# Patient Record
Sex: Male | Born: 1955 | Race: White | Hispanic: No | Marital: Married | Smoking: Former smoker
Health system: Southern US, Community
[De-identification: ages and names within clinical notes are randomized; demographics above are authoritative.]

## PROBLEM LIST (undated history)

## (undated) DIAGNOSIS — E785 Hyperlipidemia, unspecified: Secondary | ICD-10-CM

## (undated) DIAGNOSIS — G4730 Sleep apnea, unspecified: Secondary | ICD-10-CM

## (undated) HISTORY — DX: Hyperlipidemia, unspecified: E78.5

## (undated) HISTORY — DX: Sleep apnea, unspecified: G47.30

## (undated) HISTORY — PX: INGUINAL HERNIA REPAIR: SUR1180

---

## 1998-10-20 ENCOUNTER — Emergency Department (HOSPITAL_COMMUNITY): Admission: EM | Admit: 1998-10-20 | Discharge: 1998-10-20 | Payer: Self-pay

## 2000-08-19 ENCOUNTER — Ambulatory Visit (HOSPITAL_BASED_OUTPATIENT_CLINIC_OR_DEPARTMENT_OTHER): Admission: RE | Admit: 2000-08-19 | Discharge: 2000-08-19 | Payer: Self-pay | Admitting: Internal Medicine

## 2009-04-23 ENCOUNTER — Ambulatory Visit: Payer: Self-pay | Admitting: Sports Medicine

## 2009-04-23 ENCOUNTER — Encounter: Admission: RE | Admit: 2009-04-23 | Discharge: 2009-04-23 | Payer: Self-pay | Admitting: *Deleted

## 2009-04-23 DIAGNOSIS — M542 Cervicalgia: Secondary | ICD-10-CM | POA: Insufficient documentation

## 2009-04-23 LAB — CONVERTED CEMR LAB
HDL goal, serum: 40 mg/dL
LDL Goal: 160 mg/dL

## 2009-04-24 DIAGNOSIS — E785 Hyperlipidemia, unspecified: Secondary | ICD-10-CM | POA: Insufficient documentation

## 2009-05-02 ENCOUNTER — Ambulatory Visit: Payer: Self-pay | Admitting: Sports Medicine

## 2009-05-07 ENCOUNTER — Ambulatory Visit: Payer: Self-pay | Admitting: Sports Medicine

## 2009-05-07 DIAGNOSIS — M533 Sacrococcygeal disorders, not elsewhere classified: Secondary | ICD-10-CM | POA: Insufficient documentation

## 2009-05-07 DIAGNOSIS — M479 Spondylosis, unspecified: Secondary | ICD-10-CM | POA: Insufficient documentation

## 2009-11-27 ENCOUNTER — Ambulatory Visit: Payer: Self-pay | Admitting: Pulmonary Disease

## 2009-11-27 DIAGNOSIS — G2581 Restless legs syndrome: Secondary | ICD-10-CM | POA: Insufficient documentation

## 2009-11-27 DIAGNOSIS — G4710 Hypersomnia, unspecified: Secondary | ICD-10-CM | POA: Insufficient documentation

## 2009-11-27 DIAGNOSIS — J309 Allergic rhinitis, unspecified: Secondary | ICD-10-CM | POA: Insufficient documentation

## 2009-12-14 ENCOUNTER — Encounter: Payer: Self-pay | Admitting: Pulmonary Disease

## 2009-12-14 ENCOUNTER — Ambulatory Visit (HOSPITAL_BASED_OUTPATIENT_CLINIC_OR_DEPARTMENT_OTHER): Admission: RE | Admit: 2009-12-14 | Discharge: 2009-12-14 | Payer: Self-pay | Admitting: Pulmonary Disease

## 2009-12-19 ENCOUNTER — Ambulatory Visit: Payer: Self-pay | Admitting: Pulmonary Disease

## 2009-12-31 ENCOUNTER — Telehealth (INDEPENDENT_AMBULATORY_CARE_PROVIDER_SITE_OTHER): Payer: Self-pay | Admitting: *Deleted

## 2010-01-20 ENCOUNTER — Telehealth (INDEPENDENT_AMBULATORY_CARE_PROVIDER_SITE_OTHER): Payer: Self-pay | Admitting: *Deleted

## 2010-02-13 ENCOUNTER — Ambulatory Visit (HOSPITAL_BASED_OUTPATIENT_CLINIC_OR_DEPARTMENT_OTHER): Admission: RE | Admit: 2010-02-13 | Discharge: 2010-02-13 | Payer: Self-pay | Admitting: Orthopedic Surgery

## 2010-12-02 NOTE — Progress Notes (Signed)
Summary: cpap/ speak to pcc  Phone Note Call from Patient Call back at Work Phone [phone]   Caller: Patient Call For: sood Summary of Call: pt needs to speak to pcc re: confusion about his cpap ordering. [phone] Initial call taken by: Tivis Ringer, CNA,  January 20, 2010 10:13 AM  Follow-up for Phone Call        spoke to [email or web address]  02 and made her aware that pt is ready to get set up with cpap to call and talk to him about his portion of the cost of the machine and get him set up asap  Follow-up by: Oneita Jolly,  January 20, 2010 10:48 AM

## 2010-12-02 NOTE — Assessment & Plan Note (Signed)
Summary: sleep apnea/apc   Primary Provider/Referring Provider:  Dr. Renford Dills  CC:  Sleep consult. Epworth score is 12. The patient had a sleep study about 10 years ago at [facility].Marland Kitchen  History of Present Illness: 55 yo male evaluation of sleep problems.  He is friends with Kathrine Haddock with occupational health.  He expressed his concerns to her about having sleep problems.  She performed a sleep apnea screening test, and his AHI was registered at 87.  As a result it was suggested that he have further sleep evaluation.  He did have a sleep test years ago, but he did not sleep very much and was told the results were inconclusive.  He has not trouble falling asleep.  He wakes up a few times per night to use the bathroom.  He will sometimes have trouble falling back to sleep.  He says that he has a lot on his mind related to his work.  He does snore, and his wife now has to sleep in a separate room.  He has been told he stops breathing while asleep.  He will wake up gasping, especially when he is on his back.  He feels tired when he wakes up, but denies headaches.  He will get sleep while driving, and has to sometimes pull off to the side of the road.  He drinks about 4 or 5 cups of coffee per day.  He tries to nap when he can, but actually feels worse.  He is not using anything to help him sleep at night.  He has been told he grinds his teeth, and was advised to wear a mouth guard.  He gets funny feeling in his legs occasionally at night, and this can cause some difficulty falling asleep.  This does not happen too often.  He denies sleep hallucinations, sleep paralysis, cataplexy, nightmares, sleep walking, or sleep talking.  He thinks he has seasonal allergies.  He frequently gets red, irritated eyes.  He also has sinus congestion.  He uses a saline rinse daily.  He has tried nasal steroid sprays, but was only on this for a few weeks.    Preventive Screening-Counseling &  Management  Alcohol-Tobacco     Smoking Status: current     Cigars/week: 4 yearly  Current Medications (verified): 1)  Fish Oil 1000 Mg Caps (Omega-3 Fatty Acids) .Marland Kitchen.. 1 By Mouth Daily 2)  Centrum  Tabs (Multiple Vitamins-Minerals) .Marland Kitchen.. 1 By Mouth As Needed  Allergies (verified): No Known Drug Allergies  Past History:  Past Medical History: Hyperlipidemia Chronic neck and back pain  Past Surgical History: Sinus surgery as child  Family History: Heart disease---father Leukemia---mother  Social History: Is the Economist of a service company Married Patient is a current smoker. -- 4 cigars yearly Smoking Status:  current Cigars/week:  4 yearly  Review of Systems       The patient complains of shortness of breath with activity, irregular heartbeats, nasal congestion/difficulty breathing through nose, and joint stiffness or pain.  The patient denies shortness of breath at rest, productive cough, non-productive cough, coughing up blood, chest pain, acid heartburn, indigestion, loss of appetite, weight change, abdominal pain, difficulty swallowing, sore throat, tooth/dental problems, headaches, sneezing, itching, ear ache, anxiety, depression, hand/feet swelling, rash, change in color of mucus, and fever.    Vital Signs:  Patient profile:   56 year old male Height:      71 inches (180.34 cm) Weight:      202 pounds (91.82  kg) BMI:     28.28 O2 Sat:      98 % on Room air Temp:     98.0 degrees F (36.67 degrees C) oral Pulse rate:   67 / minute BP sitting:   110 / 72  (left arm) Cuff size:   regular  Vitals Entered By: Michel Bickers CMA (November 27, 2009 4:25 PM)  O2 Sat at Rest %:  98 O2 Flow:  Room air CC: Sleep consult. Epworth score is 12. The patient had a sleep study about 10 years ago at [facility].   Physical Exam  General:  normal appearance and healthy appearing.   Eyes:  PERRLA and EOMI.   Nose:  clear nasal discharge, no sinus tenderness, prominent  turbinates Mouth:  MP 3, decreased AP diameter Neck:  no JVD, no thyromegaly Lungs:  clear bilaterally to auscultation and percussion Heart:  regular rate and rhythm, S1, S2 without murmurs, rubs, gallops, or clicks Abdomen:  bowel sounds positive; abdomen soft and non-tender without masses, or organomegaly Pulses:  pulses normal Extremities:  no clubbing, cyanosis, edema, or deformity noted Neurologic:  CN II-XII grossly intact with normal reflexes, coordination, muscle strength and tone Cervical Nodes:  no significant adenopathy Psych:  alert and cooperative; normal mood and affect; normal attention span and concentration   Impression & Recommendations:  Problem # 1:  HYPERSOMNIA (ICD-780.54) His symptoms are consistant with sleep apnea.  To further evaluate this I will arrange for him to have an in-lab sleep study.  I explained to him how sleep apnea can affect his health and function.  Driving precautions were discussed.  If he has sleep apnea, and depending on the severity, he may be a good candidate for an oral appliance.  Problem # 2:  RESTLESS LEG SYNDROME (ICD-333.94) He has symptoms consistant with RLS.  This does not seem to be a prominent feature.  I will reassess whether specific interventions are needed for his legs after review of his sleep study.  Problem # 3:  ALLERGIC RHINITIS (ICD-477.9) Advised him to continue with nasal irrigation.  He can also try using OTC anti-histamines.  Advised him to try to avoid nasal decongestants as these can cause stimulation, and sleep disruption.  Medications Added to Medication List This Visit: 1)  Fish Oil 1000 Mg Caps (Omega-3 fatty acids) .Marland Kitchen.. 1 by mouth daily 2)  Centrum Tabs (Multiple vitamins-minerals) .Marland Kitchen.. 1 by mouth as needed  Complete Medication List: 1)  Fish Oil 1000 Mg Caps (Omega-3 fatty acids) .Marland Kitchen.. 1 by mouth daily 2)  Centrum Tabs (Multiple vitamins-minerals) .Marland Kitchen.. 1 by mouth as needed  Other Orders: Consultation Level  IV (95638) Sleep Disorder Referral (Sleep Disorder)  Patient Instructions: 1)  Will schedule sleep test 2)  Will call to schedule follow up after sleep test is ready

## 2010-12-02 NOTE — Progress Notes (Signed)
Summary: cpap  Phone Note Call from Patient Call back at Work Phone [phone]   Caller: Patient Call For: sood Reason for Call: Talk to Nurse Summary of Call: order for cpap machine, needs to be most compact machine available for travel. Initial call taken by: Eugene Gavia,  December 31, 2009 12:01 PM  Follow-up for Phone Call        called and spoke with pt.  pt states [facility] recently called pt on 12-27-2009  ( see append to 12-14-2009 split night sleep study) and informed pt of sleep study results and to start on cpap.  pt just wanted VS to be aware he would prefer the most compact, smallest cpap possible for traveling purposes.  I don't see anywhere in EMR where VS sent orders to PCCs to set pt upt with a cpap.  Aundra Millet Reynolds LPN  January 01, 5955 12:17 PM   Additional Follow-up for Phone Call Additional follow up Details #1::        order given to pcc after dr Vassie Loll reviewed and ok'd for order to be sent--called pt and let him know home care will contact him for cpap set up once they set him up he will call and schedule appt for 6-8 weeks for f/u Additional Follow-up by: Philipp Deputy CMA,  December 31, 2009 2:58 PM

## 2010-12-02 NOTE — Miscellaneous (Signed)
Summary: Split night sleep study  Clinical Lists Changes AHI 22, O2 nadir 87%.  CPAP 10 cm H2O with AHI 0.  Seen in REM and supine sleep.  Left message on pts voicemail informing of results, and plan to initiate CPAP.  Will have my nurse confirm receipt of message by patient, and arrange for ROV 6 to 8 weeks after initiation of CPAP.  Appended Document: Split night sleep study order was never sent to pcc so i showed this append to dr Vassie Loll and he gave the ok for [place] to send order to pcc for cpap 10cm with heated humidity and mask of choice--pt aware he should hear from home care company this week and once they get hime set up he needs to call our office for a 6-8 week f/u with dr Craige Cotta

## 2014-07-13 ENCOUNTER — Encounter: Payer: Self-pay | Admitting: Internal Medicine

## 2017-02-04 DIAGNOSIS — D179 Benign lipomatous neoplasm, unspecified: Secondary | ICD-10-CM | POA: Diagnosis not present

## 2017-02-04 DIAGNOSIS — N400 Enlarged prostate without lower urinary tract symptoms: Secondary | ICD-10-CM | POA: Diagnosis not present

## 2017-02-09 DIAGNOSIS — Z1211 Encounter for screening for malignant neoplasm of colon: Secondary | ICD-10-CM | POA: Diagnosis not present

## 2017-02-18 DIAGNOSIS — D126 Benign neoplasm of colon, unspecified: Secondary | ICD-10-CM | POA: Diagnosis not present

## 2017-02-18 DIAGNOSIS — K648 Other hemorrhoids: Secondary | ICD-10-CM | POA: Diagnosis not present

## 2017-02-18 DIAGNOSIS — Z1211 Encounter for screening for malignant neoplasm of colon: Secondary | ICD-10-CM | POA: Diagnosis not present

## 2017-04-27 DIAGNOSIS — L989 Disorder of the skin and subcutaneous tissue, unspecified: Secondary | ICD-10-CM | POA: Diagnosis not present

## 2017-04-27 DIAGNOSIS — J329 Chronic sinusitis, unspecified: Secondary | ICD-10-CM | POA: Diagnosis not present

## 2017-05-24 DIAGNOSIS — L570 Actinic keratosis: Secondary | ICD-10-CM | POA: Diagnosis not present

## 2017-05-24 DIAGNOSIS — R229 Localized swelling, mass and lump, unspecified: Secondary | ICD-10-CM | POA: Diagnosis not present

## 2017-05-24 DIAGNOSIS — L821 Other seborrheic keratosis: Secondary | ICD-10-CM | POA: Diagnosis not present

## 2017-05-24 DIAGNOSIS — L820 Inflamed seborrheic keratosis: Secondary | ICD-10-CM | POA: Diagnosis not present

## 2017-05-24 DIAGNOSIS — D485 Neoplasm of uncertain behavior of skin: Secondary | ICD-10-CM | POA: Diagnosis not present

## 2017-07-23 DIAGNOSIS — Z23 Encounter for immunization: Secondary | ICD-10-CM | POA: Diagnosis not present

## 2017-09-14 DIAGNOSIS — M79641 Pain in right hand: Secondary | ICD-10-CM | POA: Diagnosis not present

## 2017-09-14 DIAGNOSIS — M65311 Trigger thumb, right thumb: Secondary | ICD-10-CM | POA: Diagnosis not present

## 2017-12-13 DIAGNOSIS — H35372 Puckering of macula, left eye: Secondary | ICD-10-CM | POA: Diagnosis not present

## 2017-12-13 DIAGNOSIS — H5213 Myopia, bilateral: Secondary | ICD-10-CM | POA: Diagnosis not present

## 2018-01-05 DIAGNOSIS — L570 Actinic keratosis: Secondary | ICD-10-CM | POA: Diagnosis not present

## 2018-01-05 DIAGNOSIS — D485 Neoplasm of uncertain behavior of skin: Secondary | ICD-10-CM | POA: Diagnosis not present

## 2018-01-05 DIAGNOSIS — B078 Other viral warts: Secondary | ICD-10-CM | POA: Diagnosis not present

## 2018-01-05 DIAGNOSIS — L814 Other melanin hyperpigmentation: Secondary | ICD-10-CM | POA: Diagnosis not present

## 2018-03-28 DIAGNOSIS — M65311 Trigger thumb, right thumb: Secondary | ICD-10-CM | POA: Diagnosis not present

## 2018-04-25 ENCOUNTER — Ambulatory Visit
Admission: RE | Admit: 2018-04-25 | Discharge: 2018-04-25 | Disposition: A | Discharge status: Home / Self Care | Payer: Self-pay | Source: Ambulatory Visit | Attending: Internal Medicine | Admitting: Internal Medicine

## 2018-04-25 ENCOUNTER — Other Ambulatory Visit: Payer: Self-pay | Admitting: Internal Medicine

## 2018-04-25 DIAGNOSIS — M542 Cervicalgia: Secondary | ICD-10-CM

## 2018-04-25 DIAGNOSIS — M67479 Ganglion, unspecified ankle and foot: Secondary | ICD-10-CM | POA: Diagnosis not present

## 2018-05-23 DIAGNOSIS — M542 Cervicalgia: Secondary | ICD-10-CM | POA: Diagnosis not present

## 2018-06-10 DIAGNOSIS — M542 Cervicalgia: Secondary | ICD-10-CM | POA: Diagnosis not present

## 2018-06-10 DIAGNOSIS — Z125 Encounter for screening for malignant neoplasm of prostate: Secondary | ICD-10-CM | POA: Diagnosis not present

## 2018-06-10 DIAGNOSIS — E7800 Pure hypercholesterolemia, unspecified: Secondary | ICD-10-CM | POA: Diagnosis not present

## 2018-06-20 DIAGNOSIS — M65311 Trigger thumb, right thumb: Secondary | ICD-10-CM | POA: Diagnosis not present

## 2018-08-19 DIAGNOSIS — Z6828 Body mass index (BMI) 28.0-28.9, adult: Secondary | ICD-10-CM | POA: Diagnosis not present

## 2018-08-19 DIAGNOSIS — M542 Cervicalgia: Secondary | ICD-10-CM | POA: Diagnosis not present

## 2018-09-05 DIAGNOSIS — Z23 Encounter for immunization: Secondary | ICD-10-CM | POA: Diagnosis not present

## 2018-09-06 DIAGNOSIS — M542 Cervicalgia: Secondary | ICD-10-CM | POA: Diagnosis not present

## 2018-09-20 DIAGNOSIS — M4722 Other spondylosis with radiculopathy, cervical region: Secondary | ICD-10-CM | POA: Diagnosis not present

## 2018-09-20 DIAGNOSIS — M542 Cervicalgia: Secondary | ICD-10-CM | POA: Diagnosis not present

## 2018-09-20 DIAGNOSIS — R030 Elevated blood-pressure reading, without diagnosis of hypertension: Secondary | ICD-10-CM | POA: Diagnosis not present

## 2018-09-26 DIAGNOSIS — R51 Headache: Secondary | ICD-10-CM | POA: Diagnosis not present

## 2018-09-26 DIAGNOSIS — M542 Cervicalgia: Secondary | ICD-10-CM | POA: Diagnosis not present

## 2018-09-26 DIAGNOSIS — R293 Abnormal posture: Secondary | ICD-10-CM | POA: Diagnosis not present

## 2018-10-03 DIAGNOSIS — R293 Abnormal posture: Secondary | ICD-10-CM | POA: Diagnosis not present

## 2018-10-03 DIAGNOSIS — M542 Cervicalgia: Secondary | ICD-10-CM | POA: Diagnosis not present

## 2018-10-03 DIAGNOSIS — R51 Headache: Secondary | ICD-10-CM | POA: Diagnosis not present

## 2018-10-06 DIAGNOSIS — M542 Cervicalgia: Secondary | ICD-10-CM | POA: Diagnosis not present

## 2018-10-06 DIAGNOSIS — R51 Headache: Secondary | ICD-10-CM | POA: Diagnosis not present

## 2018-10-06 DIAGNOSIS — R293 Abnormal posture: Secondary | ICD-10-CM | POA: Diagnosis not present

## 2018-10-10 DIAGNOSIS — M542 Cervicalgia: Secondary | ICD-10-CM | POA: Diagnosis not present

## 2018-10-10 DIAGNOSIS — R293 Abnormal posture: Secondary | ICD-10-CM | POA: Diagnosis not present

## 2018-10-10 DIAGNOSIS — R51 Headache: Secondary | ICD-10-CM | POA: Diagnosis not present

## 2018-10-14 DIAGNOSIS — M542 Cervicalgia: Secondary | ICD-10-CM | POA: Diagnosis not present

## 2018-10-14 DIAGNOSIS — R293 Abnormal posture: Secondary | ICD-10-CM | POA: Diagnosis not present

## 2018-10-14 DIAGNOSIS — R51 Headache: Secondary | ICD-10-CM | POA: Diagnosis not present

## 2018-10-17 DIAGNOSIS — R51 Headache: Secondary | ICD-10-CM | POA: Diagnosis not present

## 2018-10-17 DIAGNOSIS — M542 Cervicalgia: Secondary | ICD-10-CM | POA: Diagnosis not present

## 2018-10-17 DIAGNOSIS — R293 Abnormal posture: Secondary | ICD-10-CM | POA: Diagnosis not present

## 2018-11-10 DIAGNOSIS — M4722 Other spondylosis with radiculopathy, cervical region: Secondary | ICD-10-CM | POA: Diagnosis not present

## 2018-11-10 DIAGNOSIS — R51 Headache: Secondary | ICD-10-CM | POA: Diagnosis not present

## 2018-11-10 DIAGNOSIS — M542 Cervicalgia: Secondary | ICD-10-CM | POA: Diagnosis not present

## 2018-11-10 DIAGNOSIS — R293 Abnormal posture: Secondary | ICD-10-CM | POA: Diagnosis not present

## 2018-11-14 DIAGNOSIS — R293 Abnormal posture: Secondary | ICD-10-CM | POA: Diagnosis not present

## 2018-11-14 DIAGNOSIS — M542 Cervicalgia: Secondary | ICD-10-CM | POA: Diagnosis not present

## 2018-11-14 DIAGNOSIS — R51 Headache: Secondary | ICD-10-CM | POA: Diagnosis not present

## 2018-12-19 DIAGNOSIS — H35372 Puckering of macula, left eye: Secondary | ICD-10-CM | POA: Diagnosis not present

## 2018-12-20 DIAGNOSIS — M542 Cervicalgia: Secondary | ICD-10-CM | POA: Diagnosis not present

## 2018-12-20 DIAGNOSIS — Z6828 Body mass index (BMI) 28.0-28.9, adult: Secondary | ICD-10-CM | POA: Diagnosis not present

## 2018-12-20 DIAGNOSIS — M4722 Other spondylosis with radiculopathy, cervical region: Secondary | ICD-10-CM | POA: Diagnosis not present

## 2019-01-10 DIAGNOSIS — M542 Cervicalgia: Secondary | ICD-10-CM | POA: Diagnosis not present

## 2019-01-10 DIAGNOSIS — Z6828 Body mass index (BMI) 28.0-28.9, adult: Secondary | ICD-10-CM | POA: Diagnosis not present

## 2019-01-10 DIAGNOSIS — R030 Elevated blood-pressure reading, without diagnosis of hypertension: Secondary | ICD-10-CM | POA: Diagnosis not present

## 2019-11-17 ENCOUNTER — Other Ambulatory Visit: Payer: Self-pay | Admitting: Internal Medicine

## 2019-11-17 DIAGNOSIS — R05 Cough: Secondary | ICD-10-CM

## 2019-11-17 DIAGNOSIS — R053 Chronic cough: Secondary | ICD-10-CM

## 2019-11-22 ENCOUNTER — Other Ambulatory Visit: Payer: Self-pay

## 2019-12-01 ENCOUNTER — Ambulatory Visit: Payer: Self-pay

## 2019-12-06 ENCOUNTER — Ambulatory Visit
Admission: RE | Admit: 2019-12-06 | Discharge: 2019-12-06 | Disposition: A | Discharge status: Home / Self Care | Payer: Medicare Other | Source: Ambulatory Visit | Attending: Internal Medicine | Admitting: Internal Medicine

## 2019-12-06 DIAGNOSIS — R05 Cough: Secondary | ICD-10-CM

## 2019-12-06 DIAGNOSIS — R053 Chronic cough: Secondary | ICD-10-CM

## 2019-12-09 ENCOUNTER — Ambulatory Visit: Payer: Medicare Other | Attending: Internal Medicine

## 2019-12-09 DIAGNOSIS — Z23 Encounter for immunization: Secondary | ICD-10-CM

## 2019-12-09 NOTE — Progress Notes (Signed)
   Covid-19 Vaccination Clinic  Name:  Wesley Singh    MRN: [id number] DOB: July 23, 1956  12/09/2019  Mr. Wesley Singh was observed post Covid-19 immunization for 15 minutes without incidence. He was provided with Vaccine Information Sheet and instruction to access the V-Safe system.   Mr. Wesley Singh was instructed to call 911 with any severe reactions post vaccine: Marland Kitchen Difficulty breathing  . Swelling of your face and throat  . A fast heartbeat  . A bad rash all over your body  . Dizziness and weakness    Immunizations Administered    Name Date Dose VIS Date Route   Pfizer COVID-19 Vaccine 12/09/2019  4:07 PM 0.3 mL 10/13/2019 Intramuscular   Manufacturer: [place]   Lot: [id number]   [place]: [id number]

## 2019-12-22 ENCOUNTER — Ambulatory Visit: Payer: Self-pay

## 2020-01-03 ENCOUNTER — Ambulatory Visit: Payer: Medicare Other | Attending: Internal Medicine

## 2020-01-03 DIAGNOSIS — Z23 Encounter for immunization: Secondary | ICD-10-CM | POA: Insufficient documentation

## 2020-01-03 NOTE — Progress Notes (Signed)
   Covid-19 Vaccination Clinic  Name:  Wesley Singh    MRN: [id number] DOB: 03/19/1956  01/03/2020  [name] was observed post Covid-19 immunization for 15 minutes without incident. He was provided with Vaccine Information Sheet and instruction to access the V-Safe system.   [name] was instructed to call 911 with any severe reactions post vaccine: Marland Kitchen Difficulty breathing  . Swelling of face and throat  . A fast heartbeat  . A bad rash all over body  . Dizziness and weakness   Immunizations Administered    Name Date Dose VIS Date Route   Pfizer COVID-19 Vaccine 01/03/2020 12:50 PM 0.3 mL 10/13/2019 Intramuscular   Manufacturer: [place]   Lot: [id number]   [place]: [id number]

## 2020-02-14 ENCOUNTER — Encounter: Payer: Self-pay | Admitting: Pulmonary Disease

## 2020-02-14 ENCOUNTER — Ambulatory Visit (INDEPENDENT_AMBULATORY_CARE_PROVIDER_SITE_OTHER): Payer: Medicare Other | Admitting: Pulmonary Disease

## 2020-02-14 ENCOUNTER — Other Ambulatory Visit: Payer: Self-pay

## 2020-02-14 VITALS — BP 122/64 | HR 82 | Temp 97.5°F | Ht 70.5 in | Wt 200.4 lb

## 2020-02-14 DIAGNOSIS — R05 Cough: Secondary | ICD-10-CM | POA: Diagnosis not present

## 2020-02-14 DIAGNOSIS — R059 Cough, unspecified: Secondary | ICD-10-CM

## 2020-02-14 MED ORDER — MONTELUKAST SODIUM 10 MG PO TABS: 10.0000 mg | ORAL_TABLET | Freq: Every day | ORAL | 2 refills | Status: AC

## 2020-02-14 MED ORDER — OMEPRAZOLE 20 MG PO CPDR: 20.0000 mg | DELAYED_RELEASE_CAPSULE | Freq: Every day | ORAL | 3 refills | Status: AC

## 2020-02-14 NOTE — Patient Instructions (Addendum)
Protracted cough likely secondary to postnasal drip and reflux  Nasal steroid-examples will include Flonase, Nasonex, Nasacort-this should be used daily, most of them are 2 puffs each nostril daily  Medication for reflux-Prilosec prescribed  Singulair-affect labile response to triggers allergens  Behavioral modifications-elevation of the head of the bed, allow 3 to 4 hours after last meal prior to going to bed  I will see you in about 8 weeks  Call with significant concerns  We can consider performing a breathing study after next visit if you still have persistence of symptoms

## 2020-02-14 NOTE — Progress Notes (Signed)
Wesley Singh    [id number]    25-Apr-1956  Primary Care Physician:Polite, Jori Moll, MD  Referring Physician: Seward Carol, MD [address] [place],  [place] [address]  Chief complaint:   Patient with about 1 year history of persistent cough, protracted cough  HPI:  Chronic cough Travel antibiotics initially  Does have a history of postnasal drip Has symptoms of reflux-Nexium did not help in the past  He has nasal stuffiness congestion, has had sinus surgery in the past for nasal stuffiness congestion He does have constant clearing of his throat  Denies any significant burning sensation  He does have symptoms more significantly when he is laying down  Reformed smoker, smoked for only about 5 years-in his younger years  No family history of chronic lung disease  He has obstructive sleep apnea for which he uses CPAP on a regular basis  Outpatient Encounter Medications as of 02/14/2020  Medication Sig  . meloxicam (MOBIC) 15 MG tablet Take 15 mg by mouth daily as needed for pain.  . rosuvastatin (CRESTOR) 5 MG tablet Take 5 mg by mouth daily.  . [DISCONTINUED] zolpidem (AMBIEN) 10 MG tablet Take 10 mg by mouth at bedtime as needed for sleep.  . montelukast (SINGULAIR) 10 MG tablet Take 1 tablet (10 mg total) by mouth at bedtime.  Marland Kitchen omeprazole (PRILOSEC) 20 MG capsule Take 1 capsule (20 mg total) by mouth daily.   No facility-administered encounter medications on file as of 02/14/2020.    Allergies as of 02/14/2020  . (No Known Allergies)    Past Medical History:  Diagnosis Date  . Hyperlipidemia   . Sleep apnea     Past Surgical History:  Procedure Laterality Date  . INGUINAL HERNIA REPAIR Bilateral     Family History  Problem Relation Age of Onset  . Leukemia Mother   . Heart disease Father     Social History   Socioeconomic History  . Marital status: Married    Spouse name: Not on file  . Number of children: Not on file    . Years of education: Not on file  . Highest education level: Not on file  Occupational History  . Not on file  Tobacco Use  . Smoking status: Former Smoker    Packs/day: 0.50    Years: 4.00    Pack years: 2.00    Types: Cigarettes  . Smokeless tobacco: Never Used  Substance and Sexual Activity  . Alcohol use: Yes  . Drug use: No  . Sexual activity: Not on file  Other Topics Concern  . Not on file  Social History Narrative  . Not on file   Social Determinants of Health   Financial Resource Strain:   . Difficulty of Paying Living Expenses:   Food Insecurity:   . Worried About Charity fundraiser in the Last Year:   . Arboriculturist in the Last Year:   Transportation Needs:   . Film/video editor (Medical):   Marland Kitchen Lack of Transportation (Non-Medical):   Physical Activity:   . Days of Exercise per Week:   . Minutes of Exercise per Session:   Stress:   . Feeling of Stress :   Social Connections:   . Frequency of Communication with Friends and Family:   . Frequency of Social Gatherings with Friends and Family:   . Attends Religious Services:   . Active Member of Clubs or Organizations:   .  Attends Archivist Meetings:   Marland Kitchen Marital Status:   Intimate Partner Violence:   . Fear of Current or Ex-Partner:   . Emotionally Abused:   Marland Kitchen Physically Abused:   . Sexually Abused:     Review of Systems  Constitutional: Positive for fatigue.  HENT: Negative.   Respiratory: Positive for apnea and cough.   Psychiatric/Behavioral: Positive for sleep disturbance.    Vitals:   02/14/20 0859  BP: 122/64  Pulse: 82  Temp: (!) 97.5 F (36.4 C)  SpO2: 98%     Physical Exam  Constitutional: He appears well-nourished.  HENT:  Head: Normocephalic.  Eyes: Pupils are equal, round, and reactive to light. Conjunctivae are normal. Right eye exhibits no discharge. Left eye exhibits no discharge.  Neck: No tracheal deviation present. No thyromegaly present.   Cardiovascular: Normal rate and regular rhythm.  Pulmonary/Chest: Effort normal and breath sounds normal. No respiratory distress. He has no wheezes. He has no rales. He exhibits no tenderness.  Abdominal: Soft. Bowel sounds are normal. He exhibits no distension. There is no abdominal tenderness.  Musculoskeletal:        General: No edema.     Cervical back: Normal range of motion and neck supple.  Neurological: He is alert.  Skin: Skin is warm and dry.     Data Reviewed: CT scan from February 2021 reviewed with the patient showing no acute infiltrate  Assessment:  Chronic cough -Multifactorial -Likely related to combination of postnasal drip and reflux  Obstructive sleep apnea -Compliant with CPAP therapy  Chronic fatigue -Nonrestorative sleep likely related to multiple awakenings from constant congestion and cough  Plan/Recommendations: Nasal steroid Prilosec for reflux Singulair  I will see him back in about 8 weeks  Encouraged to call with any significant concerns General modifications that may impact reflux symptoms was discussed -Elevation of the hemoglobin -Earlier evening meal  PFT may be considered if symptoms not better with above treatment  Sherrilyn Rist MD [facility] 02/14/2020, 9:40 AM  CC: Seward Carol, MD

## 2020-03-08 ENCOUNTER — Ambulatory Visit: Payer: Medicare Other | Admitting: Pulmonary Disease

## 2020-03-22 ENCOUNTER — Ambulatory Visit: Payer: Medicare Other | Admitting: Pulmonary Disease

## 2020-05-07 ENCOUNTER — Other Ambulatory Visit: Payer: Self-pay | Admitting: Pulmonary Disease

## 2020-09-17 IMAGING — CT CT CHEST W/O CM
2 of 4 series · 13 of 36 positions shown, 16 images · non-contrast
Comparison: None.

CLINICAL DATA: Chronic cough.

EXAM:
CT CHEST WITHOUT CONTRAST
TECHNIQUE: Multidetector CT imaging of the chest was performed following the
standard protocol without IV contrast.

[Series 2: chest 2.00 br40 s3 · axial · 0.59mm/px · z∈[+1555,+1841]mm · 10 of 171 slices shown, 13 images (1 of 2)]
[im 14/171  mediastinal]
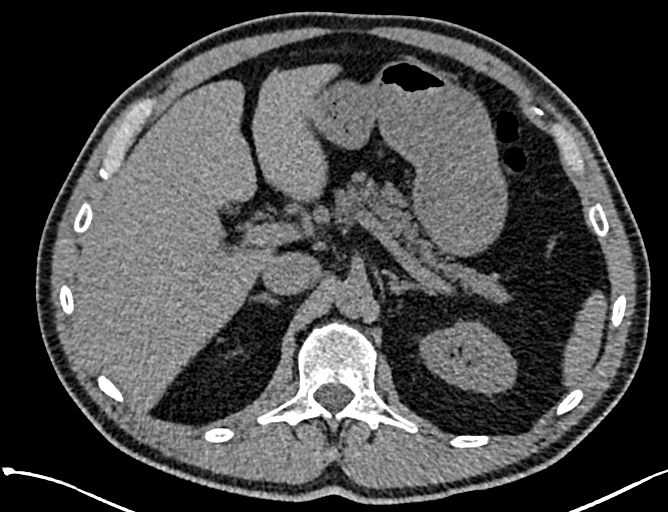
[im 14/171  lung]
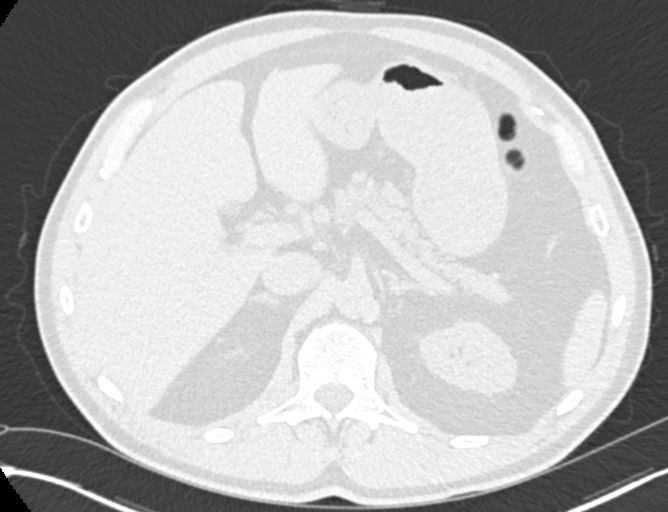
[im 27/171  lung]
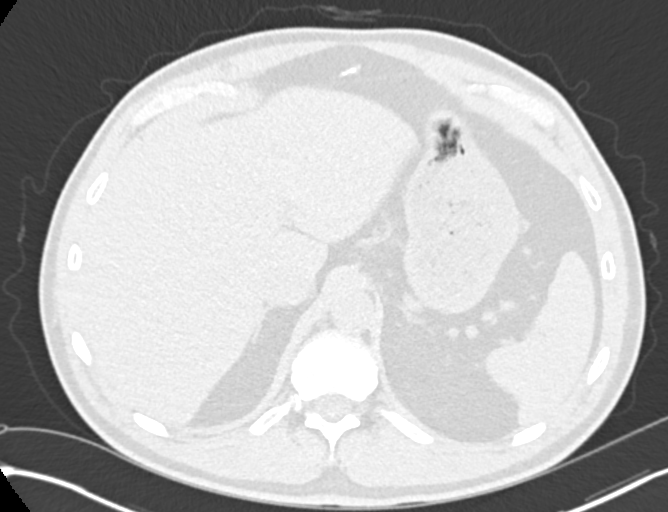
[im 53/171  lung]
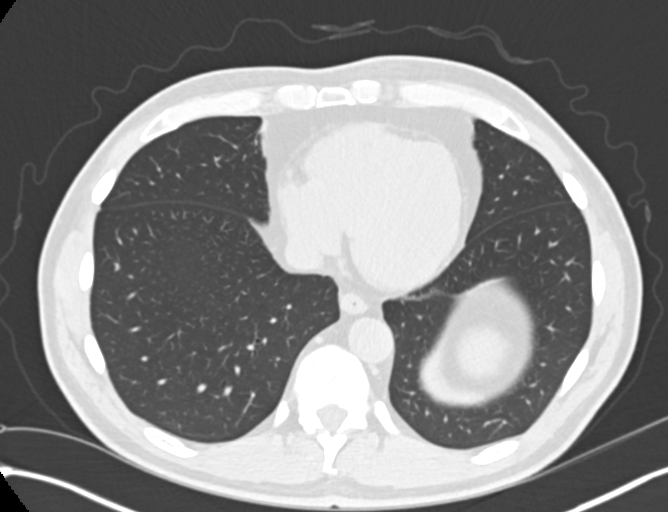
[im 66/171  lung]
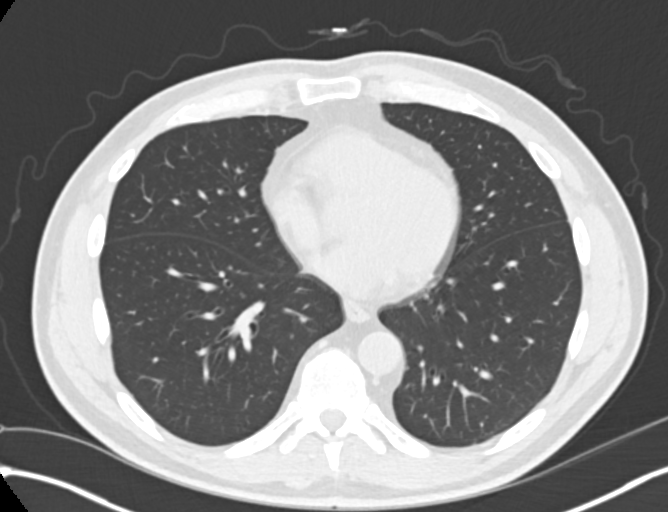
[im 79/171  mediastinal]
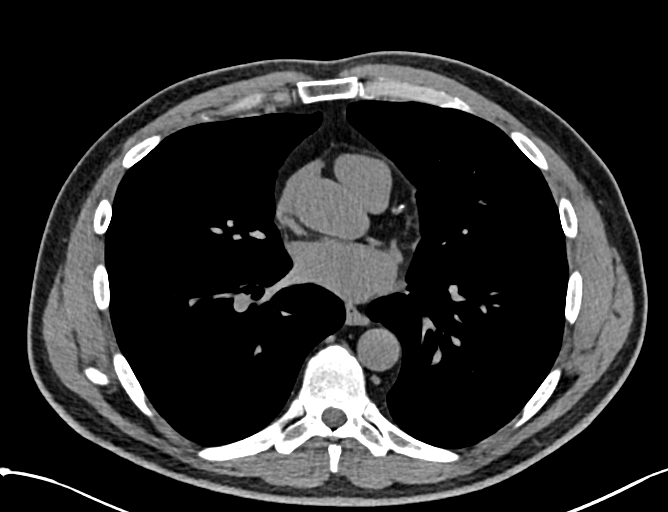
[im 79/171  lung]
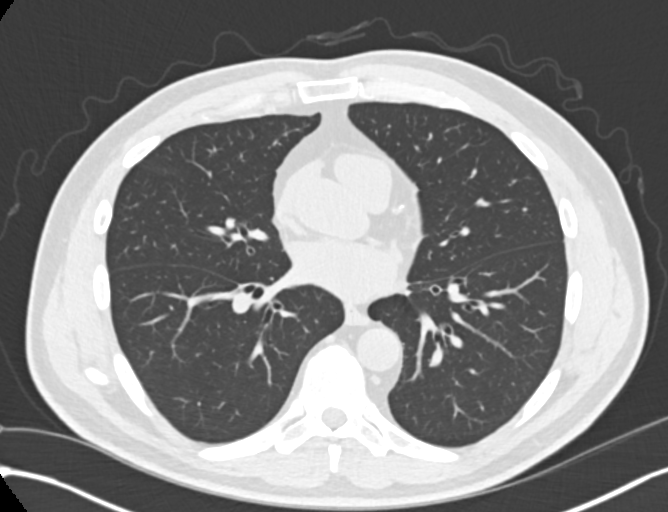
[im 92/171  lung]
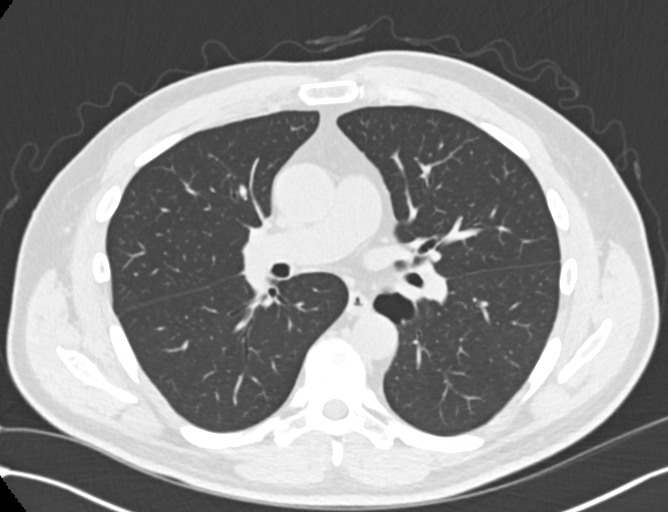
[im 105/171  lung]
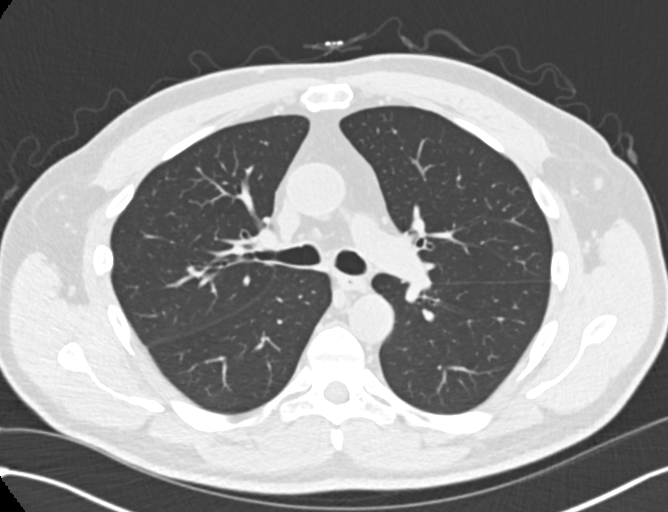
[im 131/171  lung]
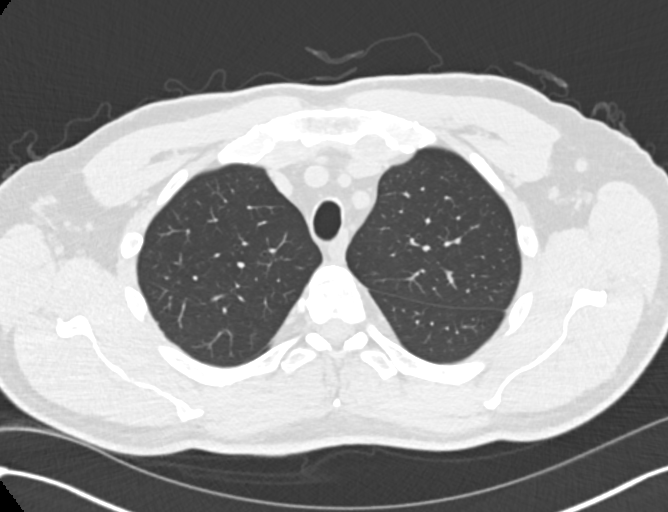
[im 144/171  mediastinal]
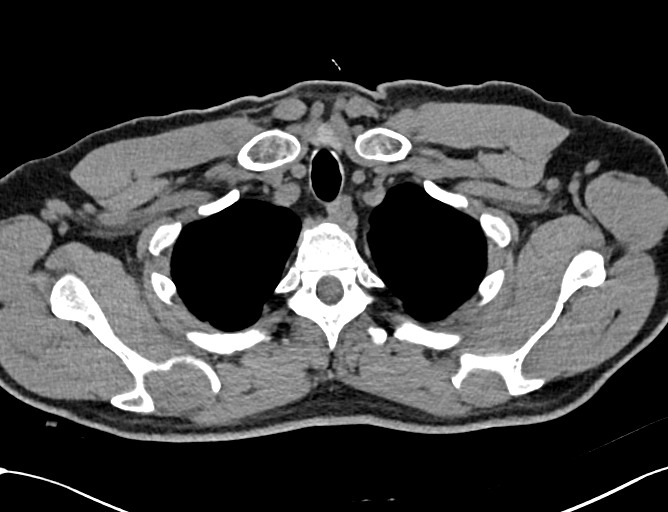
[im 144/171  lung]
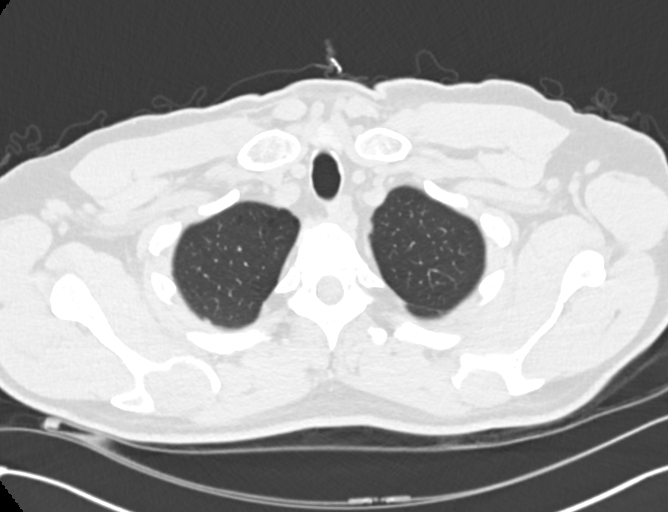
[im 157/171  lung]
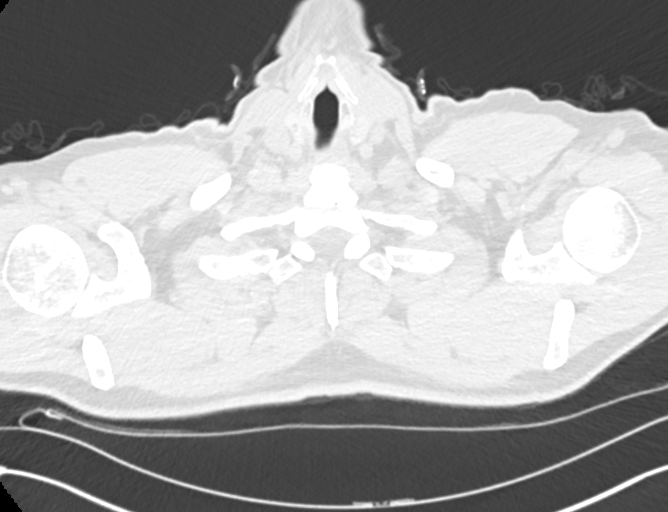

[Series 4: chest 2.00 br40 s3 · coronal · 0.67mm/px · 3 of 152 slices shown (2 of 2)]
[im 31/152  lung]
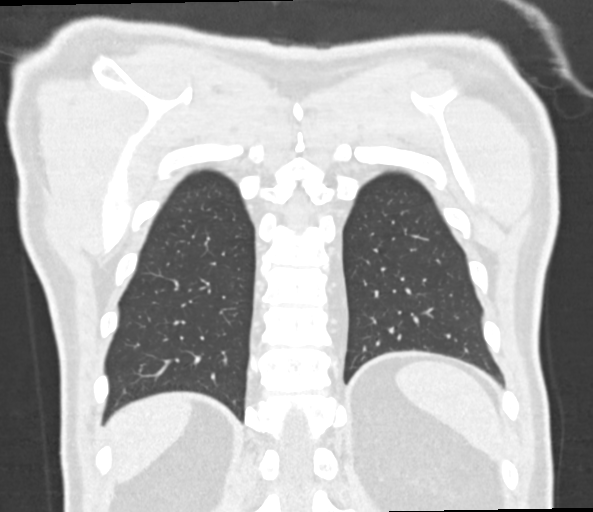
[im 61/152  lung]
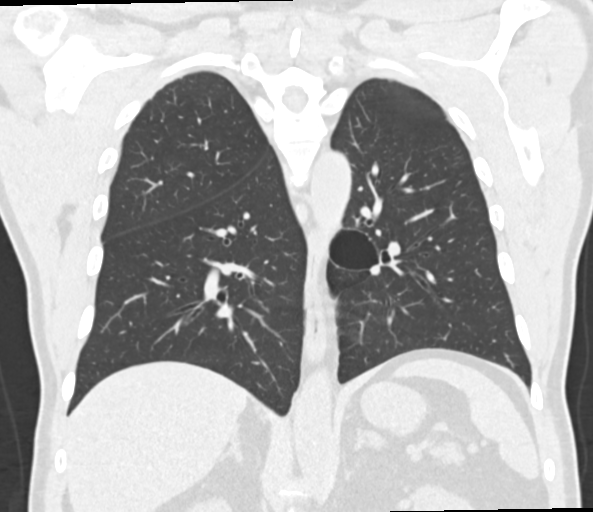
[im 91/152  lung]
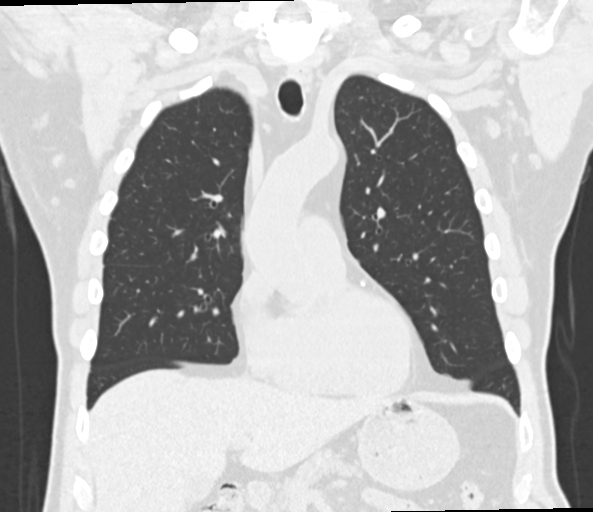

[13 of 36 positions shown; findings below may reference images not displayed]

FINDINGS: Cardiovascular: No significant vascular findings. Normal heart size.
No pericardial effusion.

Mediastinum/Nodes: No enlarged mediastinal or axillary lymph nodes.
Thyroid gland, trachea, and esophagus demonstrate no significant
findings.

Lungs/Pleura: Lungs are clear. No pleural effusion or pneumothorax.

Upper Abdomen: No acute abnormality.

Musculoskeletal: No chest wall mass or suspicious bone lesions
identified.
IMPRESSION: No definite abnormality seen in the chest.
